# Patient Record
Sex: Female | Born: 1955 | Race: White | Hispanic: No | Marital: Married | Smoking: Never smoker
Health system: Southern US, Community
[De-identification: ages and names within clinical notes are randomized; demographics above are authoritative.]

## PROBLEM LIST (undated history)

## (undated) DIAGNOSIS — G8929 Other chronic pain: Secondary | ICD-10-CM

## (undated) DIAGNOSIS — M542 Cervicalgia: Secondary | ICD-10-CM

## (undated) HISTORY — PX: ABDOMINAL HYSTERECTOMY: SHX81

## (undated) HISTORY — PX: NECK SURGERY: SHX720

---

## 1998-07-18 ENCOUNTER — Other Ambulatory Visit: Admission: RE | Admit: 1998-07-18 | Discharge: 1998-07-18 | Payer: Self-pay | Admitting: Obstetrics and Gynecology

## 1999-03-12 ENCOUNTER — Inpatient Hospital Stay (HOSPITAL_COMMUNITY): Admission: RE | Admit: 1999-03-12 | Discharge: 1999-03-15 | Payer: Self-pay | Admitting: Obstetrics and Gynecology

## 1999-03-22 ENCOUNTER — Inpatient Hospital Stay (HOSPITAL_COMMUNITY): Admission: AD | Admit: 1999-03-22 | Discharge: 1999-03-22 | Payer: Self-pay | Admitting: Obstetrics and Gynecology

## 1999-11-03 ENCOUNTER — Encounter: Admission: RE | Admit: 1999-11-03 | Discharge: 1999-11-03 | Payer: Self-pay | Admitting: Obstetrics and Gynecology

## 2001-01-14 ENCOUNTER — Ambulatory Visit (HOSPITAL_BASED_OUTPATIENT_CLINIC_OR_DEPARTMENT_OTHER): Admission: RE | Admit: 2001-01-14 | Discharge: 2001-01-14 | Payer: Self-pay | Admitting: *Deleted

## 2001-04-08 ENCOUNTER — Other Ambulatory Visit: Admission: RE | Admit: 2001-04-08 | Discharge: 2001-04-08 | Payer: Self-pay | Admitting: Obstetrics and Gynecology

## 2006-07-06 ENCOUNTER — Ambulatory Visit: Payer: Self-pay | Admitting: Obstetrics and Gynecology

## 2008-03-08 ENCOUNTER — Ambulatory Visit: Payer: Self-pay | Admitting: Family Medicine

## 2008-10-03 ENCOUNTER — Ambulatory Visit: Payer: Self-pay

## 2009-04-03 ENCOUNTER — Ambulatory Visit: Payer: Self-pay | Admitting: Unknown Physician Specialty

## 2009-04-16 ENCOUNTER — Ambulatory Visit: Payer: Self-pay | Admitting: Unknown Physician Specialty

## 2009-12-25 ENCOUNTER — Ambulatory Visit: Payer: Self-pay

## 2011-11-09 ENCOUNTER — Emergency Department: Payer: Self-pay | Admitting: *Deleted

## 2012-07-14 ENCOUNTER — Ambulatory Visit: Payer: Self-pay

## 2013-05-09 LAB — BASIC METABOLIC PANEL
Co2: 23 mmol/L (ref 21–32)
Creatinine: 0.75 mg/dL (ref 0.60–1.30)
EGFR (African American): >60
EGFR (Non-African Amer.): >60
Osmolality: 272 (ref 275–301)
Potassium: 3 mmol/L — ABNORMAL LOW (ref 3.5–5.1)
Sodium: 136 mmol/L (ref 136–145)

## 2013-05-09 LAB — CBC
HGB: 14.3 g/dL (ref 12.0–16.0)
MCH: 30.7 pg (ref 26.0–34.0)
MCHC: 35.6 g/dL (ref 32.0–36.0)
MCV: 86 fL (ref 80–100)
RBC: 4.67 10*6/uL (ref 3.80–5.20)

## 2013-05-09 LAB — CK TOTAL AND CKMB (NOT AT ARMC): CK-MB: 0.9 ng/mL (ref 0.5–3.6)

## 2013-05-09 LAB — TROPONIN I: Troponin-I: <0.02 ng/mL

## 2013-05-10 ENCOUNTER — Observation Stay: Payer: Self-pay | Admitting: Student

## 2013-05-10 ENCOUNTER — Ambulatory Visit: Payer: Self-pay | Admitting: Neurology

## 2013-05-10 LAB — CK-MB: CK-MB: <0.5 ng/mL — ABNORMAL LOW (ref 0.5–3.6)

## 2015-04-05 NOTE — Discharge Summary (Signed)
PATIENT NAMLouis Mcmahon:  Mcmahon, [name] MR#:  [id number] DATE OF BIRTH:  January 29, 1956  DATE OF ADMISSION:  05/10/2013 DATE OF DISCHARGE:  05/11/2013  CONSULTANTS: Susan BrownsYuriy Zeylikman, MD (Neurology).  CHIEF COMPLAINT: Left-sided chest pain with left arm pain.   DISCHARGE DIAGNOSES: 1.  Left arm pain associated with short episode of chest pain, likely not cardiac related and possibly cervical radiculopathy related.  2.  History of hypertension.  3.  History of neck surgery in the past.   DISCHARGE MEDICATIONS:  1.  Carvedilol 6.25 mg 2 times a day. 2.  Lyrica 75 mg at bedtime.   DIET: Low sodium.   ACTIVITY: As tolerated.   DISCHARGE INSTRUCTIONS:  Please follow with PCP within 1 to 2 weeks. Please follow with orthopedic doctor within 1 to 2 weeks.   DISPOSITION: Home.   SIGNIFICANT LABORATORY AND DIAGNOSTICS: Troponins negative x 2. CK-MB negative x 2.  Potassium 3.0. BMP within normal limits. WBC within normal limits.   The patient had an ultrasound of left upper extremity to evaluate for DVT, which was negative. Nuclear medicine stress test showing low risk scan with EF of about 68%. No significant wall motion abnormalities noted.  HISTORY OF PRESENT ILLNESS AND HOSPITAL COURSE: For full details of H and P, please see the dictation by Dr. 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 She did also experience an episode of chest pain, which was pressure like feeling, 7 out of 10, and therefore was admitted to the hospitalist service to rule out acute coronary syndrome. Imaging was not repeated here. She had 2 negative troponins and had no significant chest pains and underwent a stress test, which was negative. Given the cervical DJD  and symptoms of radiculopathy, the patient was also seen by Dr. Loretha Mcmahon from neurology. We had attempted to get MRI results from her orthopedist, however, we were not successful. The symptoms of the left upper extremity did improve with Lyrica and baclofen, but the patient did have some "wooziness" with probably baclofen and that was stopped. The Lyrica was changed to take at bedtime.  At this point, as she has been ruled out for acute coronary syndrome with low risk stress test, she will be discharged and she is to follow with her orthopedist for her radiculopathy and neck issues. She also did have no evidence for DVT on the left upper extremity ultrasound.   CODE STATUS: FULL CODE.   TOTAL TIME SPENT: 35 minutes. ____________________________ Susan EatonShayiq Kaci Dillie, MD sa:sb D: 05/12/2013 08:38:28 ET T: 05/12/2013 11:25:36 ET JOB#: [id number]  cc: Susan EatonShayiq Rasean Joos, MD, <Dictator> Susan EatonSHAYIQ Winifred Bodiford MD ELECTRONICALLY SIGNED 05/17/2013 22:14

## 2015-04-05 NOTE — H&P (Signed)
PATIENT NAMEJEIRY, [name] MR#:  [id number] DATE OF BIRTH:  [birth year]  DATE OF ADMISSION:  05/09/2013  PRIMARY CARE PHYSICIAN: Dr. Nira Conn.   REFERRING PHYSICIAN: Suella Broad.   CHIEF COMPLAINT: Left-sided chest pain, left arm pain.   HISTORY OF PRESENT ILLNESS: [name] is a 59 year old, pleasant, Caucasian female with a history of cervical degenerative joint disease with cervical radiculopathy, with a history of cervical fusion. Recently, her cervical pain got worse, and she had workup with MRI; however, results of the MRI are still pending. Nevertheless, over the last 2 days, the patient is experiencing pain in the left side of the neck radiating to the left arm. The pain is more severe than usual, especially yesterday. Nevertheless, today she developed additional to that left-sided chest pain described as a pressure-like feeling. Severity is 7 on a scale of 10. She indicated that she had some shortness of breath yesterday that was mild but not today. She has also mild nausea but no vomiting. She thinks that her nausea could be related to her pain medication. The patient is concerned about her chest pain as she has a strong family history of premature coronary artery disease. Her mother died at the age of 86 from a heart attack. The patient is now in the process to be admitted as observation on telemetry to follow up on cardiac enzymes and to consider a stress test.   REVIEW OF SYSTEMS:  CONSTITUTIONAL: Denies any fever. No chills. No fatigue.  EYES: No blurring of vision. No double vision.  ENT: No hearing impairment. No sore throat. No dysphagia.  CARDIOVASCULAR: Reports the left-sided chest pain as described above. Mild shortness of breath yesterday but none today. No edema. No syncope. No palpitations.  RESPIRATORY: No cough. No hemoptysis. She has chest pain as described above.  GASTROINTESTINAL: No abdominal pain. No vomiting. No diarrhea.  GENITOURINARY: No dysuria. No  frequency of urination.  MUSCULOSKELETAL: No joint pain or swelling. No muscular pain or swelling other than her left-sided neck pain and left arm pain.  INTEGUMENTARY: No skin rash. No ulcers.  NEUROLOGY: No focal weakness. No seizure activity. No headache.  PSYCHIATRY: No anxiety. No depression, only by history. She takes treatment for her depression. Does not recall the name of the medicine.  ENDOCRINE: No polyuria or polydipsia. No heat or cold intolerance.   PAST MEDICAL HISTORY: Systemic hypertension, Bell's palsy, cervical degenerative joint disease status post fusion.   PAST SURGICAL HISTORY: Cervical fusion, history of hysterectomy and C-section.   SOCIAL HABITS: Nonsmoker. No history of alcohol or drug abuse.   FAMILY HISTORY: Her mother died at the age of 19 from a heart attack. Her father died from colon cancer.   SOCIAL HISTORY: She is separated from her husband over the last 9 years. 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   ADMISSION MEDICATIONS: Depression medication and blood pressure medication. She does not recall the names. Vicodin 3 to 4 tablets a day and Ambien 5 mg at night.   ALLERGIES: SHE IS ALLERGIC TO ERYTHROMYCIN, CODEINE AND PENICILLIN, CAUSING RASH AND HIVES.   PHYSICAL EXAMINATION:  VITAL SIGNS: Blood pressure 139/91, respiratory rate 18, pulse 73, temperature 98.4, oxygen saturation 98%.  GENERAL APPEARANCE: This is a middle-aged female lying in bed in no acute distress.  HEAD AND NECK: No pallor. No icterus. No cyanosis. Ear examination revealed normal hearing, no discharge, no lesions. Examination of the nose showed no discharge, no bleeding. Examination of  the oropharyngeal area showed normal lips and tongue. No oral thrush. Eye examination revealed normal eyelids and conjunctivae. Pupils about 4 to 5 mm, round, equal and reactive to light. Neck is supple. Trachea at midline. No thyromegaly. No cervical lymphadenopathy. No masses.   HEART: Normal S1, S2. No S3, S4. No murmur. No gallop. No carotid bruits.  RESPIRATORY: Normal breathing pattern without use of accessory muscles. No rales. No wheezing.  ABDOMEN: Soft without tenderness. No hepatosplenomegaly. No masses. No hernias.  SKIN: No ulcers. No subcutaneous nodules.  MUSCULOSKELETAL: No joint swelling. No clubbing.  NEUROLOGIC: Cranial nerves II through XII were intact. No focal motor deficit.  PSYCHIATRIC: The patient is alert and oriented x3. Mood and affect were normal.   LABORATORY FINDINGS: Her chest x-ray showed no acute cardiopulmonary abnormalities. EKG showed normal sinus rhythm at rate of 88 per minute. Unremarkable EKG with nonspecific T wave abnormalities. Serum glucose 90, BUN 13, creatinine 0.7, sodium 136, potassium 3. Total CPK 101. Troponin less than 0.02. CBC showed white count of 8000, hemoglobin 14, hematocrit 40, platelet count 303.   ASSESSMENT:  1. Left-sided chest pain.  2. Left-sided neck pain and left arm pain. This could be related to her cervical degenerative joint disease; however, we are not certain. The pain is reproducible. She is tender upon touching the upper arm. The left side of the chest is also tender upon touching as well.  3. Hypokalemia.  4. Systemic hypertension.  5. Family history of premature coronary artery disease.   PLAN: Will admit the patient for observation on telemetry monitoring. Follow up on cardiac enzymes. Since we are not sure about the chest pain, whether it is related to her cervical problem or it is a separate problem and also given her family history of premature coronary artery disease, will pursue a stress test. If this is negative, then the patient can follow up as an outpatient with her orthopedist regarding the cervical problem. In the interim, I will add aspirin a day along with a small dose of beta blocker, Coreg 6.25 mg twice a day. Pain control with Percocet or morphine p.r.n.   Time spent in  evaluating this patient took more than 55 minutes.    ____________________________ Carney CornersAmir M. Rudene Rearwish, MD amd:gb D: 05/10/2013 00:40:54 ET T: 05/10/2013 01:00:28 ET JOB#: [id number]  cc: Carney CornersAmir M. Rudene Rearwish, MD, <Dictator> Zollie ScaleAMIR M Shadi Larner MD ELECTRONICALLY SIGNED 05/10/2013 7:51

## 2015-04-05 NOTE — Consult Note (Signed)
PATIENT NAMLouis Mcmahon:  Germond, Adilynne L MR#:  [id number] DATE OF BIRTH:  Sep 08, 1956  DATE OF CONSULTATION:  05/10/2013  CONSULTING PHYSICIAN:  Pauletta BrownsYuriy Romari Gasparro, MD  REASON FOR NEUROLOGICAL EVALUATION: Pain left upper extremity, rule out neuropathy.   HISTORY OF PRESENT ILLNESS:  This is a pleasant 59 year old Caucasian female with past medical history cervical degenerative disease, cervical radiculopathy status post cervical fusion 2010 done at [facility]. The patient states her pain in her cervical spine in her neck has been worsening over the past year, but more so in the past couple of days. Recently about 2 weeks ago, the patient as outpatient received an MRI at an outside facility of her cervical spine that we currently do not have results of. The patient states for the past 3 days she has been experiencing pain that started on the left side of her neck radiating to her left upper extremity, as well as left side of the chest. The pain is described as shooting and tingling pain that comes and goes. The pain is not improved with any position and worsened by movement and lifting her left upper extremity. Initially, she is admitted with suspicion of chest pain with suspicion of acute coronary syndrome. Currently, troponin x 2 is negative.  REVIEW OF SYSTEMS:  Denies any fever, chills, fatigue.  EYES: No blurred vision. ENT: Hearing is impaired. No sore throat.  CARDIOVASCULAR: Reports left-sided chest pain.  RESPIRATORY: No cough. No hemoptysis.  GASTROINTESTINAL: No abdominal pain.  GENITOURINARY: No dysuria. TEXT>>. MUSCULOSKELETAL: No joint pain or swelling.  NEUROLOGICAL: No focal weakness. No seizures. No headaches.  PSYCHIATRIC:  History of anxiety on benzodiazepines in home.   PAST MEDICAL HISTORY: Systemic hypertension. History of Bell's palsy, cervical degenerative joint disease status post fusion 2010.   PAST SURGICAL HISTORY: Cervical fusion and hysterectomy and C-section.    SOCIAL HISTORY: Nonsmoker. No history of alcohol or drug abuse. The patient is Environmental health practitioneradministrative assistant.   FAMILY HISTORY: Mother died in [id number] of massive heart attack at the age of 57. Father died of colon cancer.   MEDICATIONS AT HOME: She is on Vicodin and Ambien, as well as benzodiazepine that she does not remember the name of for anxiety.   PHYSICAL EXAMINATION: VITAL SIGNS: Include temperature of 99.3, pulse 62, respirations 16, blood pressure 135/78, saturating 95% on room air.  The patient is alert, awake, oriented to time, place, location the reason why she is in the hospital. She is able to tell me the President of [place]. Speech is fluent.  Attention and concentration appeared to be intact. She was able to tell me how many quarters in $1.50.  Repetition is intact.  Cranial nerves examination, extraocular movements are intact. Pupils 3 mm to 2 mm reactive bilaterally. Visual fields intact bilaterally. Facial sensation intact. Facial motor is intact. Tongue is midline. Uvula elevates symmetrically. Shoulder shrug intact. Motor strength examination appears to diminish left upper/left lower extremity. Left upper extremity is 4+/5 in the biceps and the triceps. There is no pronator drift and weakness is attributed to the pain.  Left lower extremity is 5- out of 5; and the reason for that is chronic knee pain. Left knee pain has been there for 2 to 3 months. Right lower extremity is 5/5.  Coordination: Finger-to-nose intact bilaterally. Sensation intact bilaterally. Reflexes 2+ symmetrical bilaterally.   IMPRESSION: This is a 59 year old Caucasian female with past medical history of degenerative cervical spine disease, status post cervical radiculopathy, status post cervical fusion  2010, status post MRI 2 weeks ago with worsening in neck pain comes in with 3-day history of worsening neck pain radiating into the left upper extremity, presenting now with shortness of breath and left  upper extremity pain radiating to the left side of the chest. The pain is described as shooting and tingling type that comes and goes. At home her pain is controlled usually with Vicodin. On neurological evaluation the pain appears to have point tenderness, specific in the left side of the neck and the left upper extremity. Currently, she is being worked up for acute coronary syndrome. Troponin x 2 are negative. This pain could also be contributed by radiculopathy.   PLAN:  Since there is point of tenderness we will start patient on baclofen 10 mg t.i.d. for a week to see if muscle spasms would improve. At the same time, we will start her for neuropathic pain of Lyrica 50 mg twice a day. Would also obtain left upper extremity ultrasound to make sure there is no deep vein thrombosis that would be contributing to the pain. Would not do any further imaging, but would obtain MRI of the c-spine from outside hospital to see the progression of the cervical disease. Would follow up with orthopedic surgeon that she usually follows up with as outpatient. No further neurological intervention at this time.  Thank you. It was a pleasure seeing this patient.  This case was discussed with the primary team.     ____________________________ Pauletta Browns, MD yz:ce D: 05/10/2013 11:05:48 ET T: 05/10/2013 11:13:14 ET JOB#: [id number]  cc: Pauletta Browns, MD, <Dictator> Pauletta Browns MD ELECTRONICALLY SIGNED 05/12/2013 14:40

## 2015-09-19 ENCOUNTER — Encounter: Payer: Self-pay | Admitting: Urgent Care

## 2015-09-19 ENCOUNTER — Emergency Department
Admission: EM | Admit: 2015-09-19 | Discharge: 2015-09-19 | Disposition: A | Discharge status: Home / Self Care | Payer: BLUE CROSS/BLUE SHIELD | Attending: Emergency Medicine | Admitting: Emergency Medicine

## 2015-09-19 ENCOUNTER — Emergency Department: Payer: BLUE CROSS/BLUE SHIELD

## 2015-09-19 DIAGNOSIS — Y998 Other external cause status: Secondary | ICD-10-CM | POA: Insufficient documentation

## 2015-09-19 DIAGNOSIS — S99912A Unspecified injury of left ankle, initial encounter: Secondary | ICD-10-CM | POA: Diagnosis present

## 2015-09-19 DIAGNOSIS — S82892A Other fracture of left lower leg, initial encounter for closed fracture: Secondary | ICD-10-CM

## 2015-09-19 DIAGNOSIS — S8262XA Displaced fracture of lateral malleolus of left fibula, initial encounter for closed fracture: Secondary | ICD-10-CM | POA: Insufficient documentation

## 2015-09-19 DIAGNOSIS — Y9389 Activity, other specified: Secondary | ICD-10-CM | POA: Diagnosis not present

## 2015-09-19 DIAGNOSIS — Z880 Allergy status to penicillin: Secondary | ICD-10-CM | POA: Diagnosis not present

## 2015-09-19 DIAGNOSIS — S79912A Unspecified injury of left hip, initial encounter: Secondary | ICD-10-CM | POA: Diagnosis not present

## 2015-09-19 DIAGNOSIS — X58XXXA Exposure to other specified factors, initial encounter: Secondary | ICD-10-CM | POA: Diagnosis not present

## 2015-09-19 DIAGNOSIS — Y9289 Other specified places as the place of occurrence of the external cause: Secondary | ICD-10-CM | POA: Insufficient documentation

## 2015-09-19 HISTORY — DX: Cervicalgia: M54.2

## 2015-09-19 HISTORY — DX: Other chronic pain: G89.29

## 2015-09-19 MED ORDER — OXYCODONE-ACETAMINOPHEN 5-325 MG PO TABS
1.0000 | ORAL_TABLET | Freq: Once | ORAL | Status: AC
  Administered 2015-09-19: 1 via ORAL
  Filled 2015-09-19: qty 1

## 2015-09-19 MED ORDER — IBUPROFEN 800 MG PO TABS
800.0000 mg | ORAL_TABLET | Freq: Once | ORAL | Status: AC
  Administered 2015-09-19: 800 mg via ORAL
  Filled 2015-09-19: qty 1

## 2015-09-19 MED ORDER — IBUPROFEN 800 MG PO TABS: 800.0000 mg | ORAL_TABLET | Freq: Three times a day (TID) | ORAL | Status: AC | PRN

## 2015-09-19 NOTE — ED Notes (Signed)
Patient with no complaints at this time. Respirations even and unlabored. Skin warm/dry. Discharge instructions reviewed with patient at this time. Patient given opportunity to voice concerns/ask questions. Patient discharged at this time and left Emergency Department, via wheelchair.   

## 2015-09-19 NOTE — ED Notes (Signed)
Patient presents with c/o LEFT ankle pain and swelling s/p inversion injury. Patient was walking her dog and stepped in a hole. (+) PMS noted; cap refill WNL; foot warm and dry.

## 2015-09-19 NOTE — ED Notes (Signed)
Ice applied to LEFT ankle injury per protocol secondary to pain and swelling s/p inversion injury.

## 2015-09-19 NOTE — ED Provider Notes (Signed)
[facility] Emergency Department Provider Note  ____________________________________________  Time seen: Approximately 9:32 PM  I have reviewed the triage vital signs and the nursing notes.   HISTORY  Chief Complaint Ankle Pain    HPI [name] is a 59 y.o. female who twisted her left ankle earlier today. She presents with severe pain, swelling. Unable to bear weight. She denies injury to her left knee. She had mild left hip pain that has improved. She has a history of chronic neck pain and takes hydrocodone daily.   Past Medical History  Diagnosis Date  . Chronic neck pain     There are no active problems to display for this patient.   Past Surgical History  Procedure Laterality Date  . Abdominal hysterectomy    . Neck surgery      Current Outpatient Rx  Name  Route  Sig  Dispense  Refill  . ibuprofen (ADVIL,MOTRIN) 800 MG tablet   Oral   Take 1 tablet (800 mg total) by mouth every 8 (eight) hours as needed.   15 tablet   0     Allergies Codeine; Erythromycin; and Penicillins  No family history on file.  Social History Social History  Substance Use Topics  . Smoking status: Never Smoker   . Smokeless tobacco: None  . Alcohol Use: Yes    Review of Systems Constitutional: No fever/chills Respiratory: Denies shortness of breath. 10-point ROS otherwise negative.  ____________________________________________   PHYSICAL EXAM:  VITAL SIGNS: ED Triage Vitals  Enc Vitals Group     BP 09/19/15 2044 171/88 mmHg     Pulse Rate 09/19/15 2044 90     Resp 09/19/15 2044 22     Temp 09/19/15 2044 98.1 F (36.7 C)     Temp Source 09/19/15 2044 Oral     SpO2 09/19/15 2044 97 %     Weight 09/19/15 2044 160 lb (72.576 kg)     Height 09/19/15 2044  (1.575 m)     Head Cir --      Peak Flow --      Pain Score 09/19/15 2045 10     Pain Loc --      Pain Edu? --      Excl. in GC? --     Constitutional: Alert and oriented.  Well appearing and in no acute distress. Eyes: Conjunctivae are normal. . EOMI. Head: Atraumatic. Cardiovascular: Normal rate, regular rhythm. Grossly normal heart sounds.  Good peripheral circulation. Respiratory: Normal respiratory effort.  No retractions. Lungs CTAB. Musculoskeletal: left ankle: Swelling and tenderness over the left lateral malleoli with significant pain on range of motion. No medial tenderness. She has Achilles tenderness. 2+ DP pulse. Mild tenderness over the dorsal foot. Neurologic:  Normal speech and language. No gross focal neurologic deficits are appreciated. No gait instability. Skin:  Skin is warm, dry and intact. No rash noted. Psychiatric: Mood and affect are normal. Speech and behavior are normal.  ____________________________________________   LABS (all labs ordered are listed, but only abnormal results are displayed)  Labs Reviewed - No data to display ____________________________________________  EKG   ____________________________________________  RADIOLOGY  ____CLINICAL DATA: C/o pain and swelling of LT lateral ankle after falling in a hole while walking her dog tonight. No hx of prior injury.  EXAM: LEFT ANKLE COMPLETE - 3+ VIEW  COMPARISON: None.  FINDINGS: 7 mm avulsion fracture fragment inferior to the lateral malleolus. Adjacent soft tissue swelling. Ankle mortise intact. Medial malleolus intact.  Ankle mortise intact. Small calcaneal spurs.  IMPRESSION: 1. Avulsion fracture from the tip of the lateral malleolus.   Electronically Signed  By: Corlis Leak M.D.  On: 09/19/2015 21:03 ________________   PROCEDURES  Procedure(s) performed: None  Critical Care performed: No  ____________________________________________   INITIAL IMPRESSION / ASSESSMENT AND PLAN / ED COURSE  Pertinent labs & imaging results that were available during my care of the patient were reviewed by me and considered in my medical decision  making (see chart for details).  59 year old with acute left ankle sprain and avulsion fracture to the distal fibula. She is placed in a posterior ankle OCL. Continue ice, elevation. She will use crutches and have no weightbearing until follow-up by orthopedics. She is given ibuprofen. She has hydrocodone at home for her chronic neck pain. ____________________________________________   FINAL CLINICAL IMPRESSION(S) / ED DIAGNOSES  Final diagnoses:  Ankle fracture, left, closed, initial encounter      Ignacia Bayley, PA-C 09/19/15 2215  Rockne Menghini, MD 09/20/15 [id number]

## 2015-09-19 NOTE — Discharge Instructions (Signed)
Fibular Fracture With Rehab °The fibula is the smaller of the two lower leg bones and is vulnerable to breaks (fracture). Fibular fractures may go fully through the bone (complete) or partially (incomplete). The bone fragments are rarely out of alignment (displaced fracture). Fibula fractures may occur anywhere along the bone. However, this document only discusses fractures that do not involve a leg joint. Fibular fractures are not often a severe injury because the bone supports only about 17% of the body weight. °SYMPTOMS  °· Moderate to severe pain in the lower leg. °· Tenderness and swelling in the leg or calf. °· Bleeding and/or bruising (contusion) in the leg. °· Inability to bear weight on the injured extremity. °· Visible deformity, if the fracture is displaced. °· Numbness and coldness in the leg and foot, beyond the fracture site, if blood supply is impaired. °CAUSES  °Fractures occur when a force is placed on the bone that is greater than it can withstand. Common causes of fibular fracture include: °· Direct hit (trauma) (i.e., hockey or lacrosse check to the lower leg). °· Stress fracture (weakening of the bone from repeated stress). °· Indirect injury, caused by twisting, turning quickly, or violent muscle contraction. °RISK INCREASES WITH: °· Contact sports (i.e., football, soccer, lacrosse, hockey). °· Sports that can cause twisted ankle injury (i.e., skiing, basketball). °· Bony abnormalities (i.e., osteoporosis or bone tumors). °· Metabolism disorders, hormone problems, and nutrition deficiency and disorders (i.e., anorexia and bulimia). °· Poor strength and flexibility. °PREVENTION  °· Warm up and stretch properly before activity. °· Maintain physical fitness: °¨ Strength, flexibility, and endurance. °¨ Cardiovascular fitness. °· Wear properly fitted and padded protective equipment (i.e., shin guards for soccer). °PROGNOSIS  °If treated properly, fibular fractures usually heal in 4 to 6 weeks.    °RELATED COMPLICATIONS  °· Failure of bone to heal (nonunion). °· Bone heals in a poor position (malunion). °· Increased pressure inside the leg (compartment syndrome) due to injury that disrupts the blood supply to the leg and foot and injures the nerves and muscles (uncommon). °· Shortening of the injured bones. °· Hindrance of normal bone growth in children. °· Risks of surgery: infection, bleeding, injury to nerves (numbness, weakness, paralysis), need for further surgery. °· Longer healing time if activity is resumed too soon. °TREATMENT °Treatment first involves ice, medicine, and elevation of the leg to reduce pain and inflammation. People with fibular fractures are advised to walk using crutches. A brace or walking boot may be given to restrain the injured leg and allow for healing. Sometimes, surgery is needed to place a rod, plate, or screws in the bones in order to fix the fracture. After surgery, the leg is restrained. After restraint (with or without surgery), it is important to complete strengthening and stretching exercises to regain strength and a full range of motion. Exercises may be completed at home or with a therapist. °MEDICATION  °· If pain medicine is needed, nonsteroidal anti-inflammatory medicines (aspirin and ibuprofen), or other minor pain relievers (acetaminophen), are often advised. °· Do not take pain medicine for 7 days before surgery. °· Prescription pain relievers may be given if your health care provider thinks they are needed. Use only as directed and only as much as you need. °SEEK MEDICAL CARE IF: °· Symptoms get worse or do not improve in 2 weeks, despite treatment. °· The following occur after restraint or surgery. (Report any of these signs immediately): °¨ Swelling above or below the fracture site. °¨ Severe, persistent pain. °¨   Blue or gray skin below the fracture site, especially under the toenails. Numbness or loss of feeling below the fracture site. °¨ New, unexplained  symptoms develop. (Drugs used in treatment may produce side effects.) °EXERCISES  °RANGE OF MOTION (ROM) AND STRETCHING EXERCISES - Fibular Fracture °These exercises may help you when beginning to recover from your injury. Your symptoms may go away with or without further involvement from your physician, physical therapist or athletic trainer. While completing these exercises, remember:  °· Restoring tissue flexibility helps normal motion to return to the joints. This allows healthier, less painful movement and activity. °· An effective stretch should be held for at least 30 seconds. °· A stretch should never be painful. You should only feel a gentle lengthening or release in the stretched tissue. °RANGE OF MOTION - Dorsi/Plantar Flexion °· While sitting with your right / left knee straight, draw the top of your foot upwards by flexing your ankle. Then reverse the motion, pointing your toes downward. °· Hold each position for __________ seconds. °· After completing your first set of exercises, repeat this exercise with your knee bent. °Repeat __________ times. Complete this exercise __________ times per day.  °STRETCH - Gastrocsoleus  °· Sit with your right / left leg extended. Holding onto both ends of a belt or towel, loop it around the ball of your foot. °· Keeping your right / left ankle and foot relaxed and your knee straight, pull your foot and ankle toward you using the belt. °· You should feel a gentle stretch behind your calf or knee. Hold this position for __________ seconds. °Repeat __________ times. Complete this stretch __________ times per day.  °RANGE OF MOTION- Ankle Plantar Flexion  °· Sit with your right / left leg crossed over your opposite knee. °· Use your opposite hand to pull the top of your foot and toes toward you. °· You should feel a gentle stretch on the top of your foot and ankle. Hold this position for __________ seconds. °Repeat __________ times. Complete __________ times per day.    °RANGE OF MOTION - Ankle Eversion °· Sit with your right / left ankle crossed over your opposite knee. °· Grip your foot with your opposite hand, placing your thumb on the top of your foot and your fingers across the bottom of your foot. °· Gently push your foot downward with a slight rotation so your littlest toes rise slightly toward the ceiling. °· You should feel a gentle stretch on the inside of your ankle. Hold the stretch for __________ seconds. °Repeat __________ times. Complete this exercise __________ times per day.  °RANGE OF MOTION - Ankle Inversion °· Sit with your right / left ankle crossed over your opposite knee. °· Grip your foot with your opposite hand, placing your thumb on the bottom of your foot and your fingers across the top of your foot. °· Gently pull your foot so the smallest toe comes toward you and your thumb pushes the inside of the ball of your foot away from you. °· You should feel a gentle stretch on the outside of your ankle. Hold the stretch for __________ seconds. °Repeat __________ times. Complete this exercise __________ times per day.  °RANGE OF MOTION - Ankle Alphabet °· Imagine your right / left big toe is a pen. °· Keeping your hip and knee still, write out the entire alphabet with your "pen." Make the letters as large as you can, without increasing any discomfort. °Repeat __________ times. Complete this exercise   __________ times per day.  °RANGE OF MOTION - Ankle Dorsiflexion, Active Assisted °· Remove your shoes and sit on a chair, preferably not on a carpeted surface. °· Place your right / left foot on the floor, directly under your knee. Extend your opposite leg for support. °· Keeping your heel down, slide your right / left foot back toward the chair, until you feel a stretch at your ankle or calf. If you do not feel a stretch, slide your bottom forward to the edge of the chair, while still keeping your heel down. °· Hold this stretch for __________ seconds. °Repeat  __________ times. Complete this stretch __________ times per day.  °STRENGTHENING EXERCISES - Fibular Fracture °These exercises may help you when beginning to recover from your injury. They may resolve your symptoms with or without further involvement from your physician, physical therapist or athletic trainer. While completing these exercises, remember:  °· Muscles can gain both the endurance and the strength needed for everyday activities through controlled exercises. °· Complete these exercises as instructed by your physician, physical therapist or athletic trainer. Increase the resistance and repetitions only as guided. °· You may experience muscle soreness or fatigue, but the pain or discomfort you are trying to eliminate should never worsen during these exercises. If this pain does get worse, stop and make certain you are following the directions exactly. If the pain is still present after adjustments, discontinue the exercise until you can discuss the trouble with your clinician. °STRENGTH - Dorsiflexors °· Secure a rubber exercise band or tubing to a fixed object (table, pole) and loop the other end around your right / left foot. °· Sit on the floor, facing the fixed object. The band should be slightly tense when your foot is relaxed. °· Slowly draw your foot back toward you, using your ankle and toes. °· Hold this position for __________ seconds. Slowly release the tension in the band and return your foot to the starting position. °Repeat __________ times. Complete this exercise __________ times per day.  °STRENGTH - Plantar-flexors °· Sit with your right / left leg extended. Holding onto both ends of a rubber exercise band or tubing, loop it around the ball of your foot. Keep a slight tension in the band. °· Slowly push your toes away from you, pointing them downward. °· Hold this position for __________ seconds. Return to the starting position slowly, controlling the tension in the band. °Repeat  __________ times. Complete this exercise __________ times per day.  °STRENGTH - Plantar-flexors, Standing  °· Stand with your feet shoulder width apart. Place your hands on a wall or table to steady yourself, using as little support as needed. °· Keeping your weight evenly spread over the width of your feet, rise up on your toes.* °· Hold this position for __________ seconds. °Repeat __________ times. Complete this exercise __________ times per day.  °*If this is too easy, shift your weight toward your right / left leg until you feel challenged. Ultimately, you may be asked to do this exercise while standing on your right / left foot only. °STRENGTH - Towel Curls °· Sit in a chair, on a non-carpeted surface. °· Place your foot on a towel, keeping your heel on the floor. °· Pull the towel toward your heel only by curling your toes. Keep your heel on the floor. °· If instructed by your physician, physical therapist or athletic trainer, add ____________________ at the end of the towel. °Repeat __________ times. Complete this exercise __________   times per day. STRENGTH - Ankle Eversion  Secure one end of a rubber exercise band or tubing to a fixed object (table, pole). Loop the other end around your foot, just before your toes.  Place your fists between your knees. This will focus your strengthening at your ankle.  Drawing the band across your opposite foot, away from the pole, slowly pull your little toe out and up. Make sure the band is positioned to resist the entire motion.  Hold this position for __________ seconds.  Return to the starting position slowly, controlling the tension in the band. Repeat __________ times. Complete this exercise __________ times per day.  STRENGTH - Ankle Inversion  Secure one end of a rubber exercise band or tubing to a fixed object (table, pole). Loop the other end around your foot, just before your toes.  Place your fists between your knees. This will focus your  strengthening at your ankle.  Slowly, pull your big toe up and in, making sure the band is positioned to resist the entire motion.  Hold this position for __________ seconds.  Return to the starting position slowly, controlling the tension in the band. Repeat __________ times. Complete this exercises __________ times per day.    This information is not intended to replace advice given to you by your health care provider. Make sure you discuss any questions you have with your health care provider.   Document Released: 11/30/2005 Document Revised: 12/21/2014 Document Reviewed: 03/14/2009 Elsevier Interactive Patient Education 2016 [employer]  Take pain medicine as directed for pain control. Continue ice and elevation. Contact the orthopedist for a follow-up appointment for next week.

## 2017-01-08 ENCOUNTER — Other Ambulatory Visit: Payer: Self-pay | Admitting: Orthopedic Surgery

## 2017-01-08 DIAGNOSIS — M1711 Unilateral primary osteoarthritis, right knee: Secondary | ICD-10-CM

## 2017-05-26 ENCOUNTER — Other Ambulatory Visit: Payer: Self-pay | Admitting: Family

## 2017-05-26 DIAGNOSIS — Z1231 Encounter for screening mammogram for malignant neoplasm of breast: Secondary | ICD-10-CM

## 2017-06-04 ENCOUNTER — Ambulatory Visit
Admission: RE | Admit: 2017-06-04 | Discharge: 2017-06-04 | Disposition: A | Discharge status: Home / Self Care | Payer: BLUE CROSS/BLUE SHIELD | Source: Ambulatory Visit | Attending: Family | Admitting: Family

## 2017-06-04 DIAGNOSIS — Z1231 Encounter for screening mammogram for malignant neoplasm of breast: Secondary | ICD-10-CM | POA: Insufficient documentation

## 2017-06-09 ENCOUNTER — Inpatient Hospital Stay
Admission: RE | Admit: 2017-06-09 | Discharge: 2017-06-09 | Disposition: A | Discharge status: Home / Self Care | Payer: Self-pay | Source: Ambulatory Visit | Attending: *Deleted | Admitting: *Deleted

## 2017-06-09 ENCOUNTER — Other Ambulatory Visit: Payer: Self-pay | Admitting: *Deleted

## 2017-06-09 DIAGNOSIS — Z9289 Personal history of other medical treatment: Secondary | ICD-10-CM

## 2018-09-05 ENCOUNTER — Other Ambulatory Visit: Payer: Self-pay | Admitting: Family

## 2018-09-06 ENCOUNTER — Other Ambulatory Visit: Payer: Self-pay | Admitting: Family

## 2018-09-06 DIAGNOSIS — Z1231 Encounter for screening mammogram for malignant neoplasm of breast: Secondary | ICD-10-CM

## 2018-09-30 ENCOUNTER — Ambulatory Visit
Admission: RE | Admit: 2018-09-30 | Discharge: 2018-09-30 | Disposition: A | Discharge status: Home / Self Care | Payer: Managed Care, Other (non HMO) | Source: Ambulatory Visit | Attending: Family | Admitting: Family

## 2018-09-30 DIAGNOSIS — Z1231 Encounter for screening mammogram for malignant neoplasm of breast: Secondary | ICD-10-CM | POA: Diagnosis not present

## 2019-08-23 ENCOUNTER — Other Ambulatory Visit: Payer: Self-pay | Admitting: Family

## 2019-08-27 ENCOUNTER — Emergency Department
Admission: EM | Admit: 2019-08-27 | Discharge: 2019-08-27 | Disposition: A | Discharge status: Home / Self Care | Payer: Managed Care, Other (non HMO) | Attending: Emergency Medicine | Admitting: Emergency Medicine

## 2019-08-27 ENCOUNTER — Encounter: Payer: Self-pay | Admitting: Emergency Medicine

## 2019-08-27 ENCOUNTER — Other Ambulatory Visit: Payer: Self-pay

## 2019-08-27 ENCOUNTER — Emergency Department: Payer: Managed Care, Other (non HMO)

## 2019-08-27 DIAGNOSIS — Y999 Unspecified external cause status: Secondary | ICD-10-CM | POA: Insufficient documentation

## 2019-08-27 DIAGNOSIS — X509XXA Other and unspecified overexertion or strenuous movements or postures, initial encounter: Secondary | ICD-10-CM | POA: Insufficient documentation

## 2019-08-27 DIAGNOSIS — Y92000 Kitchen of unspecified non-institutional (private) residence as  the place of occurrence of the external cause: Secondary | ICD-10-CM | POA: Insufficient documentation

## 2019-08-27 DIAGNOSIS — M25561 Pain in right knee: Secondary | ICD-10-CM | POA: Insufficient documentation

## 2019-08-27 DIAGNOSIS — M25562 Pain in left knee: Secondary | ICD-10-CM | POA: Diagnosis not present

## 2019-08-27 DIAGNOSIS — Y9389 Activity, other specified: Secondary | ICD-10-CM | POA: Diagnosis not present

## 2019-08-27 MED ORDER — KETOROLAC TROMETHAMINE 30 MG/ML IJ SOLN
30.0000 mg | Freq: Once | INTRAMUSCULAR | Status: AC
  Administered 2019-08-27: 30 mg via INTRAMUSCULAR
  Filled 2019-08-27: qty 1

## 2019-08-27 MED ORDER — MELOXICAM 15 MG PO TABS: 15.0000 mg | ORAL_TABLET | Freq: Every day | ORAL | 1 refills | Status: AC

## 2019-08-27 NOTE — ED Triage Notes (Signed)
Pt to ED with c/o of bilateral knee pain. Pt states she injured her left knee after falling today and states her right knee hurts she believes because of compensating for her left knee.

## 2019-08-27 NOTE — ED Notes (Signed)
Pt states she has pain in her right knee, pt states left knee "popped". Pt said she went to her knees because of the pain, then this am could not walk d/t pain. Mild swelling noted to left lower kneecap.

## 2019-08-27 NOTE — Discharge Instructions (Signed)
You have been diagnosed with a hamstring tear/strain. Please apply ice to the posterior aspect of left knee. Please take meloxicam daily for pain and inflammation.  You can continue to use Norco for pain. Please follow-up with Dr. Geanie Cooley as needed.

## 2019-08-27 NOTE — ED Provider Notes (Signed)
[facility] Emergency Department Provider Note  ____________________________________________  Time seen: Approximately 8:36 PM  I have reviewed the triage vital signs and the nursing notes.   HISTORY  Chief Complaint Knee Pain    HPI [name] is a 63 y.o. female presents to the emergency department with acute on chronic right knee pain and acute left knee pain.  Patient reports that [place] and she turned abruptly in the kitchen and felt a popping sensation along the posterior aspect of her left knee.  Patient has exquisite pain to palpation along the insertion for the hamstrings.  Patient states that she has had right knee pain for a while and has been recommended that she undergo a knee replacement.  Patient states that she thinks her right knee pain is worse as her left knee has started hurting and she is walking differently.  She takes Norco chronically for neck pain.  No other alleviating measures have been attempted.        Past Medical History:  Diagnosis Date  . Chronic neck pain     There are no active problems to display for this patient.   Past Surgical History:  Procedure Laterality Date  . ABDOMINAL HYSTERECTOMY    . NECK SURGERY      Prior to Admission medications   Medication Sig Start Date End Date Taking? Authorizing Provider  ibuprofen (ADVIL,MOTRIN) 800 MG tablet Take 1 tablet (800 mg total) by mouth every 8 (eight) hours as needed. 09/19/15   Ignacia Bayleyumey, Robert, PA-C  meloxicam (MOBIC) 15 MG tablet Take 1 tablet (15 mg total) by mouth daily for 7 days. 08/27/19 09/03/19  Orvil FeilWoods, Rani Idler M, PA-C    Allergies Codeine, Erythromycin, and Penicillins  History reviewed. No pertinent family history.  Social History Social History   Tobacco Use  . Smoking status: Never Smoker  Substance Use Topics  . Alcohol use: Yes  . Drug use: Not on file     Review of Systems  Constitutional: No  fever/chills Eyes: No visual changes. No discharge ENT: No upper respiratory complaints. Cardiovascular: no chest pain. Respiratory: no cough. No SOB. Gastrointestinal: No abdominal pain.  No nausea, no vomiting.  No diarrhea.  No constipation. Genitourinary: Negative for dysuria. No hematuria Musculoskeletal: Patient has bilateral knee pain. Skin: Negative for rash, abrasions, lacerations, ecchymosis. Neurological: Negative for headaches, focal weakness or numbness.   ____________________________________________   PHYSICAL EXAM:  VITAL SIGNS: ED Triage Vitals  Enc Vitals Group     BP 08/27/19 1742 (!) 196/100     Pulse Rate 08/27/19 1742 77     Resp 08/27/19 2015 17     Temp 08/27/19 1742 98.4 F (36.9 C)     Temp Source 08/27/19 1742 Oral     SpO2 08/27/19 1742 98 %     Weight 08/27/19 1744 180 lb (81.6 kg)     Height 08/27/19 1744 5\' 2"  (1.575 m)     Head Circumference --      Peak Flow --      Pain Score 08/27/19 1752 10     Pain Loc --      Pain Edu? --      Excl. in GC? --      Constitutional: Alert and oriented. Well appearing and in no acute distress. Eyes: Conjunctivae are normal. PERRL. EOMI. Head: Atraumatic. Cardiovascular: Normal rate, regular rhythm. Normal S1 and S2.  Good peripheral circulation. Respiratory: Normal respiratory effort  without tachypnea or retractions. Lungs CTAB. Good air entry to the bases with no decreased or absent breath sounds. Musculoskeletal: Patient has exquisite tenderness to palpation along the insertion for the hamstrings on the left knee.  Provocative testing is otherwise unremarkable on the left.  Provocative testing of the right knee is limited due to pain.  Palpable dorsalis pedis pulse bilaterally and symmetrically. Neurologic:  Normal speech and language. No gross focal neurologic deficits are appreciated.  Skin:  Skin is warm, dry and intact. No rash noted. Psychiatric: Mood and affect are normal. Speech and behavior are  normal. Patient exhibits appropriate insight and judgement.   ____________________________________________   LABS (all labs ordered are listed, but only abnormal results are displayed)  Labs Reviewed - No data to display ____________________________________________  EKG   ____________________________________________  RADIOLOGY I personally viewed and evaluated these images as part of my medical decision making, as well as reviewing the written report by the radiologist.  Dg Knee Complete 4 Views Left  Result Date: 08/27/2019 CLINICAL DATA:  Fall today with knee pain, initial encounter EXAM: LEFT KNEE - COMPLETE 4+ VIEW COMPARISON:  None. FINDINGS: No evidence of fracture, dislocation, or joint effusion. No evidence of arthropathy or other focal bone abnormality. Soft tissues are unremarkable. IMPRESSION: No acute abnormality noted. Electronically Signed   By: Alcide Clever M.D.   On: 08/27/2019 19:06   Dg Knee Complete 4 Views Right  Result Date: 08/27/2019 CLINICAL DATA:  Recent fall with knee pain, initial encounter EXAM: RIGHT KNEE - COMPLETE 4+ VIEW COMPARISON:  None. FINDINGS: Mild osteophytic changes are noted most marked in the medial joint space. No acute fracture or dislocation is seen. No joint effusion is noted. IMPRESSION: Degenerative change without acute abnormality. Electronically Signed   By: Alcide Clever M.D.   On: 08/27/2019 19:05    ____________________________________________    PROCEDURES  Procedure(s) performed:    Procedures    Medications  ketorolac (TORADOL) 30 MG/ML injection 30 mg (30 mg Intramuscular Given 08/27/19 1956)     ____________________________________________   INITIAL IMPRESSION / ASSESSMENT AND PLAN / ED COURSE  Pertinent labs & imaging results that were available during my care of the patient were reviewed by me and considered in my medical decision making (see chart for details).  Review of the [place] CSRS was performed in  accordance of the [facility] prior to dispensing any controlled drugs.           Assessment and plan Knee pain 63 year old female presents to the emergency department with bilateral knee pain.  Patient reports that right knee pain is chronic but left knee pain is new to her.  Patient had exquisite pain to palpation along the posterior aspect of the left knee.  I am concerned for hamstring strain/tear.  Patient reports that she can take anti-inflammatories without GI irritation.  She was started on meloxicam.  Advised patient that I cannot prescribe her additional narcotics as she just received a 30-day supply of Norco.  She voiced understanding.  A referral to orthopedics was given.  All patient questions were answered.    ____________________________________________  FINAL CLINICAL IMPRESSION(S) / ED DIAGNOSES  Final diagnoses:  Acute pain of left knee      NEW MEDICATIONS STARTED DURING THIS VISIT:  ED Discharge Orders         Ordered    meloxicam (MOBIC) 15 MG tablet  Daily     08/27/19 2011  This chart was dictated using voice recognition software/Dragon. Despite best efforts to proofread, errors can occur which can change the meaning. Any change was purely unintentional.    Lannie Fields, PA-C 08/27/19 2040    Duffy Bruce, MD 08/29/19 [phone]

## 2019-09-11 ENCOUNTER — Other Ambulatory Visit: Payer: Self-pay | Admitting: Family

## 2019-09-11 DIAGNOSIS — Z1231 Encounter for screening mammogram for malignant neoplasm of breast: Secondary | ICD-10-CM

## 2019-11-21 ENCOUNTER — Ambulatory Visit
Admission: RE | Admit: 2019-11-21 | Discharge: 2019-11-21 | Disposition: A | Discharge status: Home / Self Care | Payer: Managed Care, Other (non HMO) | Source: Ambulatory Visit | Attending: Family | Admitting: Family

## 2019-11-21 DIAGNOSIS — Z1231 Encounter for screening mammogram for malignant neoplasm of breast: Secondary | ICD-10-CM | POA: Insufficient documentation

## 2020-02-22 LAB — COLOGUARD

## 2020-03-14 LAB — COLOGUARD: COLOGUARD: NEGATIVE

## 2020-12-20 ENCOUNTER — Other Ambulatory Visit: Payer: Self-pay | Admitting: Family

## 2020-12-20 DIAGNOSIS — Z1231 Encounter for screening mammogram for malignant neoplasm of breast: Secondary | ICD-10-CM

## 2021-01-24 ENCOUNTER — Ambulatory Visit
Admission: RE | Admit: 2021-01-24 | Discharge: 2021-01-24 | Disposition: A | Discharge status: Home / Self Care | Payer: BC Managed Care – PPO | Source: Ambulatory Visit | Attending: Family | Admitting: Family

## 2021-01-24 ENCOUNTER — Other Ambulatory Visit: Payer: Self-pay

## 2021-01-24 DIAGNOSIS — Z1231 Encounter for screening mammogram for malignant neoplasm of breast: Secondary | ICD-10-CM | POA: Insufficient documentation

## 2021-12-22 ENCOUNTER — Other Ambulatory Visit: Payer: Self-pay | Admitting: Family

## 2021-12-22 DIAGNOSIS — Z1231 Encounter for screening mammogram for malignant neoplasm of breast: Secondary | ICD-10-CM

## 2022-04-10 ENCOUNTER — Ambulatory Visit
Admission: RE | Admit: 2022-04-10 | Discharge: 2022-04-10 | Disposition: A | Discharge status: Home / Self Care | Payer: BC Managed Care – PPO | Source: Ambulatory Visit | Attending: Family | Admitting: Family

## 2022-04-10 DIAGNOSIS — Z1231 Encounter for screening mammogram for malignant neoplasm of breast: Secondary | ICD-10-CM | POA: Insufficient documentation

## 2023-03-16 ENCOUNTER — Other Ambulatory Visit: Payer: Self-pay | Admitting: Family

## 2023-03-16 DIAGNOSIS — Z1231 Encounter for screening mammogram for malignant neoplasm of breast: Secondary | ICD-10-CM

## 2023-04-23 ENCOUNTER — Ambulatory Visit
Admission: RE | Admit: 2023-04-23 | Discharge: 2023-04-23 | Disposition: A | Discharge status: Home / Self Care | Payer: BC Managed Care – PPO | Source: Ambulatory Visit | Attending: Family | Admitting: Family

## 2023-04-23 DIAGNOSIS — Z1231 Encounter for screening mammogram for malignant neoplasm of breast: Secondary | ICD-10-CM | POA: Diagnosis present

## 2023-05-17 LAB — COLOGUARD: COLOGUARD: NEGATIVE

## 2023-07-19 ENCOUNTER — Other Ambulatory Visit: Payer: Self-pay | Admitting: Family

## 2023-07-19 DIAGNOSIS — Z780 Asymptomatic menopausal state: Secondary | ICD-10-CM

## 2023-09-07 ENCOUNTER — Emergency Department (HOSPITAL_COMMUNITY): Payer: BC Managed Care – PPO

## 2023-09-07 ENCOUNTER — Other Ambulatory Visit: Payer: Self-pay

## 2023-09-07 ENCOUNTER — Encounter (HOSPITAL_COMMUNITY): Payer: Self-pay

## 2023-09-07 ENCOUNTER — Emergency Department (HOSPITAL_COMMUNITY)
Admission: EM | Admit: 2023-09-07 | Discharge: 2023-09-07 | Disposition: A | Discharge status: Home / Self Care | Payer: BC Managed Care – PPO | Attending: Emergency Medicine | Admitting: Emergency Medicine

## 2023-09-07 DIAGNOSIS — M5416 Radiculopathy, lumbar region: Secondary | ICD-10-CM | POA: Insufficient documentation

## 2023-09-07 DIAGNOSIS — M545 Low back pain, unspecified: Secondary | ICD-10-CM | POA: Diagnosis present

## 2023-09-07 MED ORDER — HYDROCODONE-ACETAMINOPHEN 5-325 MG PO TABS
1.0000 | ORAL_TABLET | Freq: Once | ORAL | Status: AC
  Administered 2023-09-07: 1 via ORAL
  Filled 2023-09-07: qty 1

## 2023-09-07 MED ORDER — HYDROMORPHONE HCL 2 MG PO TABS
2.0000 mg | ORAL_TABLET | Freq: Once | ORAL | Status: DC
Start: 2023-09-07 — End: 2023-09-07

## 2023-09-07 MED ORDER — HYDROMORPHONE HCL 2 MG PO TABS: 2.0000 mg | ORAL_TABLET | Freq: Three times a day (TID) | ORAL | 0 refills | Status: AC | PRN

## 2023-09-07 MED ORDER — HYDROMORPHONE HCL 1 MG/ML IJ SOLN
2.0000 mg | Freq: Once | INTRAMUSCULAR | Status: AC
  Administered 2023-09-07: 2 mg via INTRAMUSCULAR
  Filled 2023-09-07: qty 2

## 2023-09-07 MED ORDER — METHYLPREDNISOLONE 4 MG PO TBPK: ORAL_TABLET | ORAL | 0 refills | Status: AC

## 2023-09-07 MED ORDER — KETOROLAC TROMETHAMINE 60 MG/2ML IM SOLN
30.0000 mg | Freq: Once | INTRAMUSCULAR | Status: AC
  Administered 2023-09-07: 30 mg via INTRAMUSCULAR
  Filled 2023-09-07: qty 2

## 2023-09-07 MED ORDER — DEXAMETHASONE SODIUM PHOSPHATE 10 MG/ML IJ SOLN
10.0000 mg | Freq: Once | INTRAMUSCULAR | Status: AC
Start: 2023-09-07 — End: 2023-09-07
  Administered 2023-09-07: 10 mg via INTRAMUSCULAR
  Filled 2023-09-07: qty 1

## 2023-09-07 MED ORDER — IBUPROFEN 600 MG PO TABS: 600.0000 mg | ORAL_TABLET | Freq: Four times a day (QID) | ORAL | 0 refills | Status: AC | PRN

## 2023-09-07 NOTE — ED Notes (Signed)
Pt to CT, well appearing upon transport.

## 2023-09-07 NOTE — ED Notes (Signed)
EM PA at bedside.

## 2023-09-07 NOTE — Discharge Instructions (Addendum)
Do NOT take Norco and dilaudid at the same time.  These are both opioid narcotic medications.   You should reserve Dilaudid for severe breakthrough pain.  You should stick to Tylenol and ibuprofen for basic and moderate pain.  Do not drive after receiving Dilaudid or any opioid medications.    Please schedule follow-up appointment at the spine clinic for your lower back pain.

## 2023-09-07 NOTE — ED Notes (Signed)
Pt updated on plan of care process, NAD noted at this time. Support person at bedside.

## 2023-09-07 NOTE — ED Notes (Signed)
EM provider at bedside.

## 2023-09-07 NOTE — ED Notes (Signed)
Pt back from CT

## 2023-09-07 NOTE — ED Triage Notes (Signed)
Pt c.o lower back pain since Saturday, pain radiates down her right hip and leg with some numbness. Pt was seen at a chiropractor yesterday and had xrays done but does not have any results yet. Pt also c.o neck pain on both sides, reports hx of metal plate in her neck. Pt denies any recent trauma but states she has been lifting her 37lb dog a lot recently to help her in and out of the car and getting up the stairs.

## 2023-09-07 NOTE — ED Notes (Signed)
This RN reviewed discharge instructions with patient and family member. Both  verbalized understanding and denied any further questions. Pt well appearing upon discharge and reports tolerable pain. Pt ambulated with stable gait to exit. Pt endorses ride home.

## 2023-09-07 NOTE — ED Provider Notes (Signed)
[place] EMERGENCY DEPARTMENT AT [facility] Provider Note   CSN: [id number] Arrival date & time: 09/07/23  6301     History  Chief Complaint  Patient presents with   Back Pain    [name] is a 67 y.o. female coming to the ED with complaint of right lower back and hip pain.  Patient reports that she was picking up her heavy dog about 3 days ago and was bending forward and felt something pull in her lower back.  She has had significant pain in her lower back that radiates into her right knee since then.  Paresthesias reported on the lateral hip.  The pain is worse with walking.  She has not been able to sleep at night due to pain.  She takes chronic Norco for spine and neck pain, 5 mg with no relief.  She did go to a chiropractor yesterday and reports that they did attempt to do movement ovulation on her back and that made the pain worse.  She has seen a neurosurgeon or spinal surgeon many years in the past reports that she has "metal plates in my neck".  She cannot recall who the surgeon was, at [facility], but does not follow at the spine clinic there anymore.  HPI     Home Medications Prior to Admission medications   Medication Sig Start Date End Date Taking? Authorizing Provider  HYDROmorphone (DILAUDID) 2 MG tablet Take 1 tablet (2 mg total) by mouth every 8 (eight) hours as needed for up to 12 doses for severe pain. 09/07/23  Yes Terald Sleeper, MD  ibuprofen (ADVIL) 600 MG tablet Take 1 tablet (600 mg total) by mouth every 6 (six) hours as needed for mild pain or moderate pain. 09/07/23 10/07/23 Yes Terald Sleeper, MD  methylPREDNISolone (MEDROL DOSEPAK) 4 MG TBPK tablet Use as directed on package 09/08/23  Yes Trixie Maclaren, Kermit Balo, MD  ibuprofen (ADVIL,MOTRIN) 800 MG tablet Take 1 tablet (800 mg total) by mouth every 8 (eight) hours as needed. 09/19/15   Ignacia Bayley, PA-C      Allergies    Codeine, Erythromycin, and Penicillins    Review of Systems    Review of Systems  Physical Exam Updated Vital Signs BP (!) 158/89 (BP Location: Right Arm)   Pulse 67   Temp 98.8 F (37.1 C) (Oral)   Resp 18   Ht 5\' 2"  (1.575 m)   Wt 72.6 kg   SpO2 100%   BMI 29.26 kg/m  Physical Exam Constitutional:      General: She is not in acute distress. HENT:     Head: Normocephalic and atraumatic.  Eyes:     Conjunctiva/sclera: Conjunctivae normal.     Pupils: Pupils are equal, round, and reactive to light.  Cardiovascular:     Rate and Rhythm: Normal rate and regular rhythm.  Pulmonary:     Effort: Pulmonary effort is normal. No respiratory distress.  Abdominal:     Tenderness: There is no abdominal tenderness.  Skin:    General: Skin is warm and dry.  Neurological:     General: No focal deficit present.     Mental Status: She is alert. Mental status is at baseline.     Comments: Saddle anesthesia, positive straight leg test on the right, paresthesias reported in the L2 distribution on the right side, no motor deficits noted  Psychiatric:        Mood and Affect: Mood normal.  Behavior: Behavior normal.     ED Results / Procedures / Treatments   Labs (all labs ordered are listed, but only abnormal results are displayed) Labs Reviewed - No data to display  EKG None  Radiology CT Lumbar Spine Wo Contrast  Result Date: 09/07/2023 CLINICAL DATA:  Low back pain with acute onset. L2-3 right-sided radiculopathy. EXAM: CT LUMBAR SPINE WITHOUT CONTRAST TECHNIQUE: Multidetector CT imaging of the lumbar spine was performed without intravenous contrast administration. Multiplanar CT image reconstructions were also generated. RADIATION DOSE REDUCTION: This exam was performed according to the departmental dose-optimization program which includes automated exposure control, adjustment of the mA and/or kV according to patient size and/or use of iterative reconstruction technique. COMPARISON:  None Available. FINDINGS: Segmentation: 5 lumbar type  vertebrae. Alignment: Normal. Vertebrae: No evidence for lumbar spine fracture. No worrisome lytic or sclerotic osseous abnormality. Paraspinal and other soft tissues: Multiple layering tiny calcified gallstones identified measuring in the 6-8 mm size range. Disc levels: T12-L1: Unremarkable. L1-2: Loss of intervertebral disc height with mild posterior broad-based bulging disc. Apparent disc protrusion into the neural foramina bilaterally, right greater than left. Mild multifactorial central canal stenosis. L2-3: Minimal posterior broad-based bulging disc with prominence of ligamentum flavum. Minimal central canal stenosis. L3-4: Posterior broad-based bulging disc with mild to moderate multifactorial central canal stenosis. L4-5: Mild multifactorial central canal stenosis. L5-S1: Unremarkable. IMPRESSION: 1. No acute bony abnormality in the lumbar spine. 2. Multilevel degenerative disc disease with minimal to mild multifactorial central canal stenosis at L1-2, L2-3, L3-4 and L4-5. 3. Posterior broad-based bulging disc with neural foramina encroachment bilaterally at L1-2, right greater than left. MRI lumbar spine would likely prove helpful to further evaluate. 4. Cholelithiasis. Electronically Signed   By: Kennith Center M.D.   On: 09/07/2023 14:30    Procedures Procedures    Medications Ordered in ED Medications  HYDROmorphone (DILAUDID) tablet 2 mg (has no administration in time range)  HYDROmorphone (DILAUDID) injection 2 mg (2 mg Intramuscular Given 09/07/23 1111)  ketorolac (TORADOL) injection 30 mg (30 mg Intramuscular Given 09/07/23 1111)  dexamethasone (DECADRON) injection 10 mg (10 mg Intramuscular Given 09/07/23 1111)    ED Course/ Medical Decision Making/ A&P Clinical Course as of 09/07/23 1459  Tue Sep 07, 2023  1352 Pain improved with meds [MT]  1410 Ambulated to bathroom [MT]    Clinical Course User Index [MT] Terald Sleeper, MD                                 Medical Decision  Making Amount and/or Complexity of Data Reviewed Radiology: ordered.  Risk Prescription drug management.   This patient presents to the ED with concern for low back pain, radiculopathy. This involves an extensive number of treatment options, and is a complaint that carries with it a high risk of complications and morbidity.  The differential diagnosis includes disc herniation versus occult fracture versus spinal impingement versus other  The patient is not having complaints to me of acute neck pain, and have a low suspicion for vertebral artery dissection or acute cervical injury based on this presentation.  Her pain appears to be localized primarily in the lower back  The patient does not have saddle anesthesia, incontinence, or objective evidence of motor dysfunction to raise immediate concern for cauda equina syndrome.  I do not see an indication for an emergent MRI at this time.   I ordered imaging studies including  CT of the lumbar spine I independently visualized and interpreted imaging which showed suspected L1-L2 disc herniation I agree with the radiologist interpretation  I ordered medication including IM pain medications  I have reviewed the patients home medicines and have made adjustments as needed   After the interventions noted above, I reevaluated the patient and found that they have: improved   Dispostion:  After consideration of the diagnostic results and the patients response to treatment, I feel that the patent would benefit from outpatient follow-up in the spine clinic.  Given that the patient had improved pain and was able to ambulate in the ED I do think she is reasonably safe and stable for discharge with outpatient follow-up.  Her hospital be taking her home.  We did discuss the pain control regimen.  A very short course of Dilaudid pain medication was prescribed for home, particularly to help with sleep, until the NSAID medications and steroids can take effect.  She was clearly instructed not to mix this with Norco medications.  She verbalized understanding.         Final Clinical Impression(s) / ED Diagnoses Final diagnoses:  Lumbar radiculopathy    Rx / DC Orders ED Discharge Orders          Ordered    methylPREDNISolone (MEDROL DOSEPAK) 4 MG TBPK tablet        09/07/23 1457    HYDROmorphone (DILAUDID) 2 MG tablet  Every 8 hours PRN       Note to Pharmacy: PDMP reviewed, prior narcotics reviewed, regimen discussed with patient   09/07/23 1457    ibuprofen (ADVIL) 600 MG tablet  Every 6 hours PRN        09/07/23 1457              Terald Sleeper, MD 09/07/23 1500

## 2023-10-06 ENCOUNTER — Other Ambulatory Visit: Payer: BC Managed Care – PPO

## 2024-01-22 IMAGING — MG MM DIGITAL SCREENING BILAT W/ TOMO AND CAD
8 series · 8 of 24 positions shown · non-contrast
Comparison: Previous exam(s).

CLINICAL DATA: Screening.

EXAM:
DIGITAL SCREENING BILATERAL MAMMOGRAM WITH TOMOSYNTHESIS AND CAD
TECHNIQUE: Bilateral screening digital craniocaudal and mediolateral oblique
mammograms were obtained. Bilateral screening digital breast
tomosynthesis was performed. The images were evaluated with
computer-aided detection.

[R MLO synth-2D]
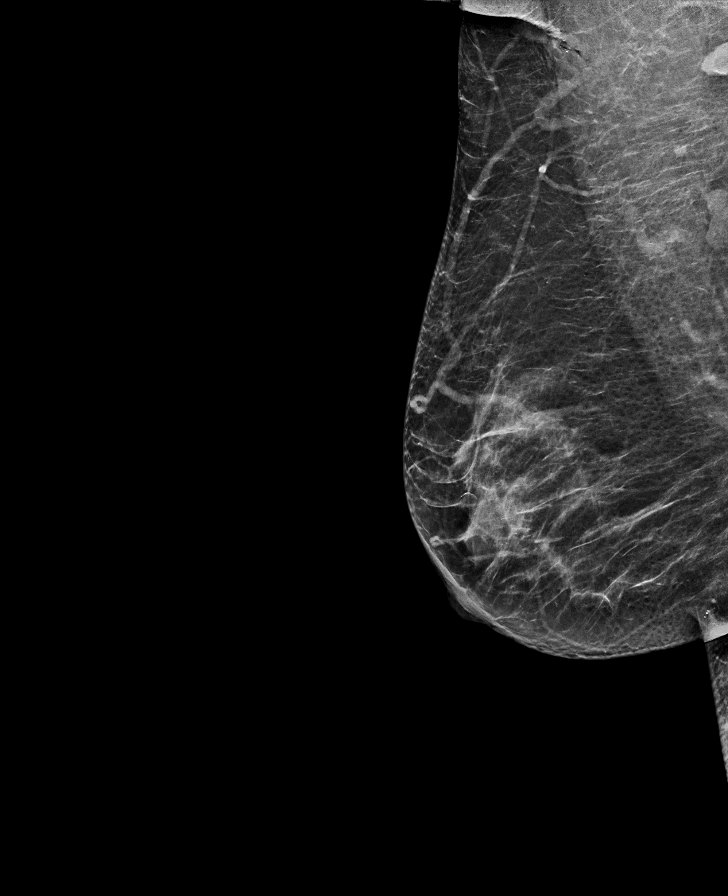

[L MLO synth-2D]
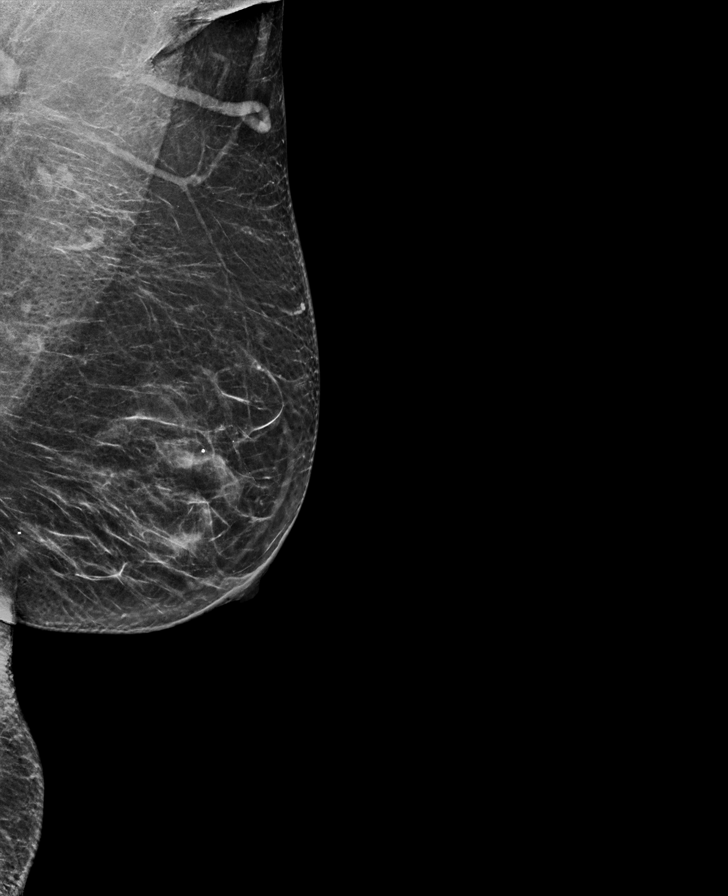

[L CC synth-2D]
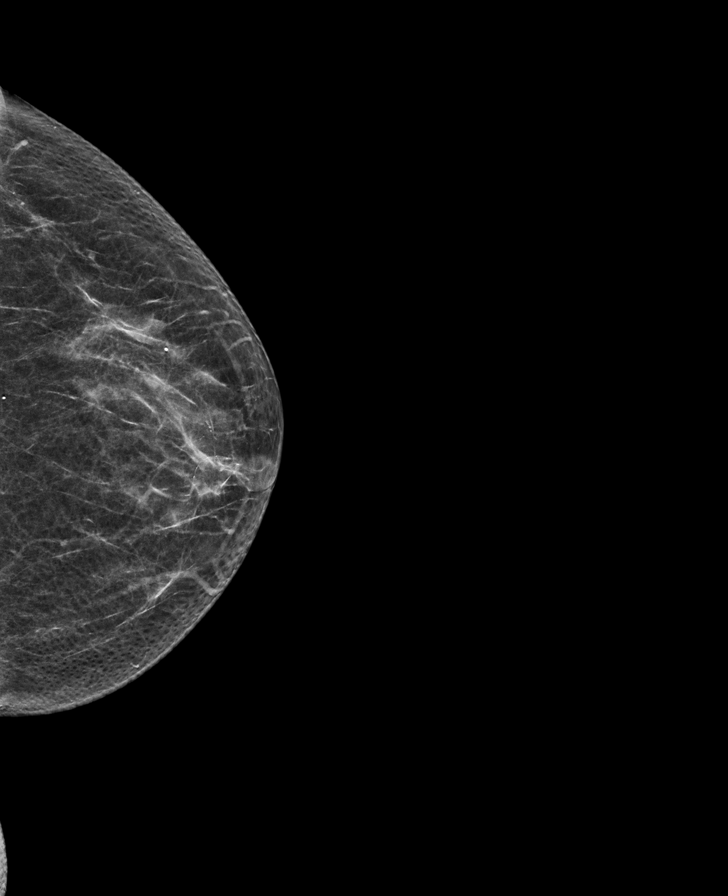

[R CC synth-2D]
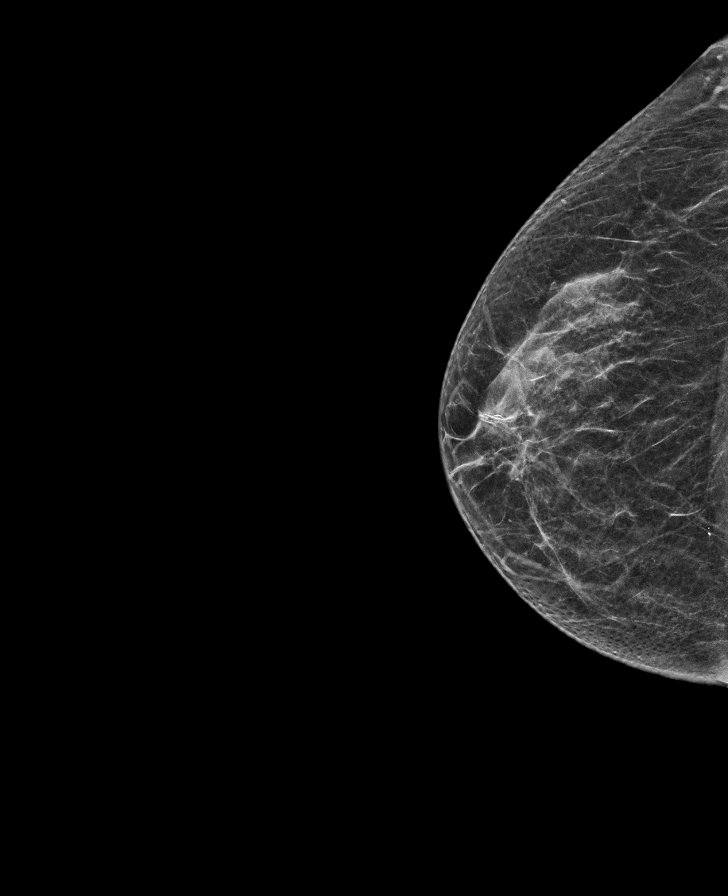

[R CC tomo · tomo slice 31/61.0]
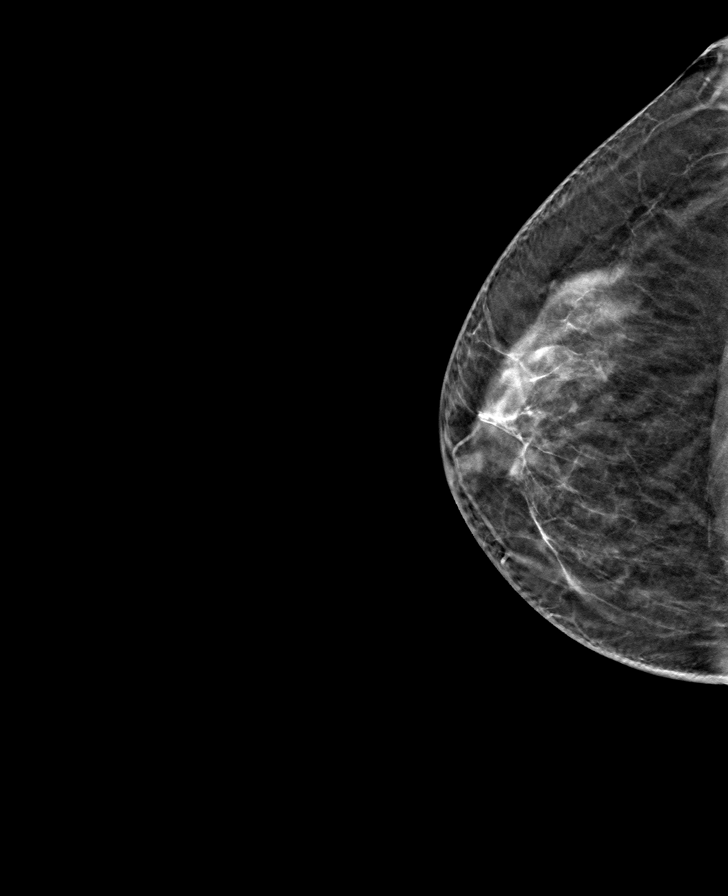

[R MLO tomo · tomo slice 33/66.0]
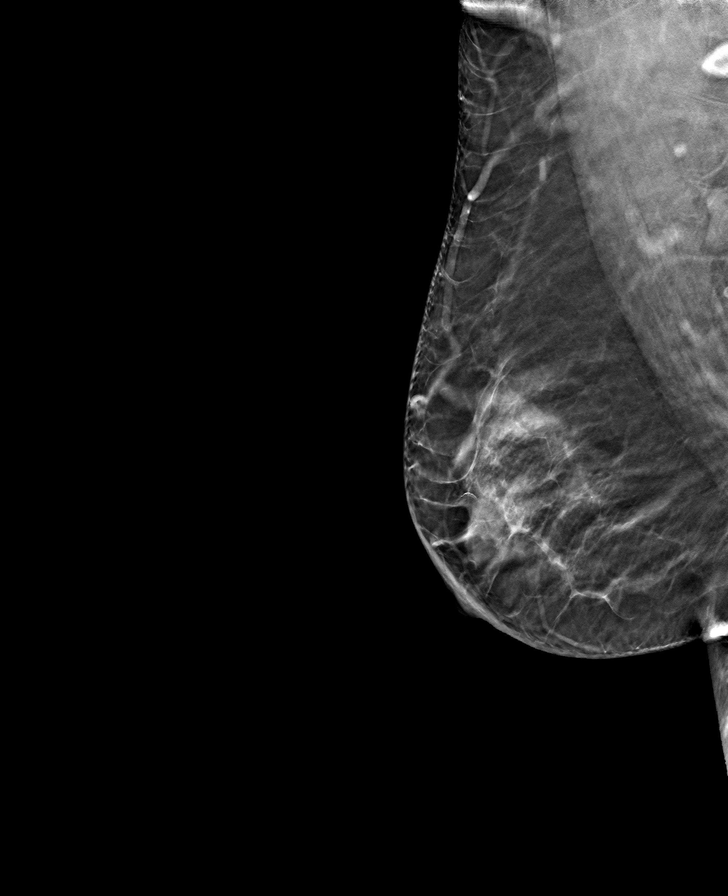

[L MLO tomo · tomo slice 35/68.0]
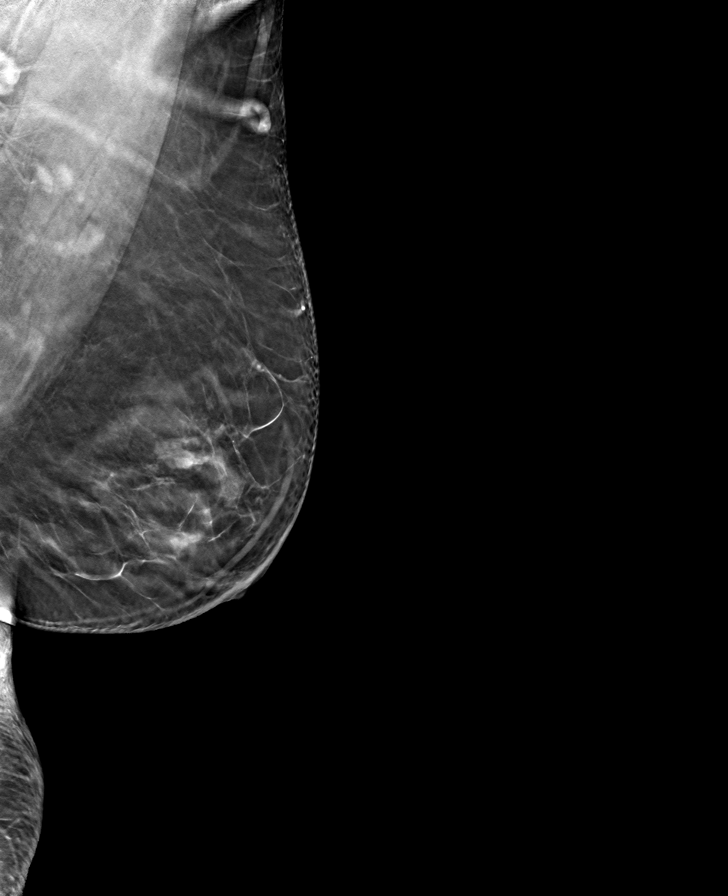

[L CC tomo · tomo slice 31/61.0]
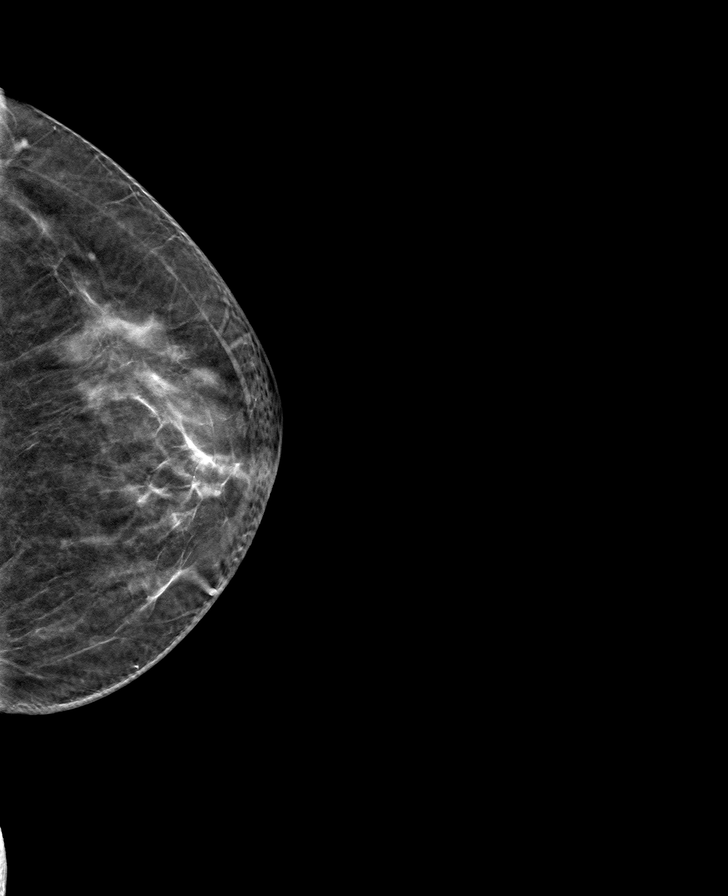

[8 of 24 positions shown; findings below may reference images not displayed]

ACR Breast Density Category b: There are scattered areas of
fibroglandular density.
FINDINGS: There are no findings suspicious for malignancy.
IMPRESSION: No mammographic evidence of malignancy. A result letter of this
screening mammogram will be mailed directly to the patient.

RECOMMENDATION:
Screening mammogram in one year. (Code:51-O-LD2)

BI-RADS CATEGORY  1: Negative.

## 2024-03-10 ENCOUNTER — Other Ambulatory Visit: Payer: Self-pay | Admitting: Family

## 2024-03-10 DIAGNOSIS — Z1231 Encounter for screening mammogram for malignant neoplasm of breast: Secondary | ICD-10-CM

## 2024-04-28 ENCOUNTER — Encounter

## 2024-05-12 ENCOUNTER — Ambulatory Visit
Admission: RE | Admit: 2024-05-12 | Discharge: 2024-05-12 | Disposition: A | Discharge status: Home / Self Care | Source: Ambulatory Visit | Attending: Family | Admitting: Family

## 2024-05-12 DIAGNOSIS — Z1231 Encounter for screening mammogram for malignant neoplasm of breast: Secondary | ICD-10-CM | POA: Insufficient documentation

## 2024-10-23 ENCOUNTER — Other Ambulatory Visit: Payer: Self-pay | Admitting: Family

## 2024-10-23 DIAGNOSIS — Z780 Asymptomatic menopausal state: Secondary | ICD-10-CM
